# Patient Record
Sex: Female | Born: 1965 | Race: White | Hispanic: No | Marital: Married | Smoking: Never smoker
Health system: Southern US, Community
[De-identification: ages and names within clinical notes are randomized; demographics above are authoritative.]

## PROBLEM LIST (undated history)

## (undated) DIAGNOSIS — C801 Malignant (primary) neoplasm, unspecified: Secondary | ICD-10-CM

## (undated) DIAGNOSIS — K219 Gastro-esophageal reflux disease without esophagitis: Secondary | ICD-10-CM

## (undated) DIAGNOSIS — G4730 Sleep apnea, unspecified: Secondary | ICD-10-CM

## (undated) DIAGNOSIS — M1990 Unspecified osteoarthritis, unspecified site: Secondary | ICD-10-CM

## (undated) HISTORY — PX: TONSILLECTOMY: SUR1361

## (undated) HISTORY — PX: CHOLECYSTECTOMY: SHX55

## (undated) HISTORY — PX: ABDOMINAL HYSTERECTOMY: SHX81

## (undated) HISTORY — PX: OTHER SURGICAL HISTORY: SHX169

---

## 1998-01-02 ENCOUNTER — Ambulatory Visit (HOSPITAL_COMMUNITY): Admission: RE | Admit: 1998-01-02 | Discharge: 1998-01-02 | Payer: Self-pay | Admitting: Obstetrics and Gynecology

## 1998-03-05 ENCOUNTER — Other Ambulatory Visit: Admission: RE | Admit: 1998-03-05 | Discharge: 1998-03-05 | Payer: Self-pay | Admitting: Obstetrics and Gynecology

## 1998-04-02 ENCOUNTER — Inpatient Hospital Stay (HOSPITAL_COMMUNITY): Admission: AD | Admit: 1998-04-02 | Discharge: 1998-04-04 | Payer: Self-pay | Admitting: Obstetrics and Gynecology

## 1998-04-30 ENCOUNTER — Other Ambulatory Visit: Admission: RE | Admit: 1998-04-30 | Discharge: 1998-04-30 | Payer: Self-pay | Admitting: Obstetrics and Gynecology

## 1999-06-23 ENCOUNTER — Other Ambulatory Visit: Admission: RE | Admit: 1999-06-23 | Discharge: 1999-06-23 | Payer: Self-pay | Admitting: Obstetrics and Gynecology

## 1999-11-07 ENCOUNTER — Other Ambulatory Visit: Admission: RE | Admit: 1999-11-07 | Discharge: 1999-11-07 | Payer: Self-pay | Admitting: Obstetrics and Gynecology

## 1999-11-20 ENCOUNTER — Other Ambulatory Visit: Admission: RE | Admit: 1999-11-20 | Discharge: 1999-11-20 | Payer: Self-pay | Admitting: Obstetrics and Gynecology

## 1999-11-20 ENCOUNTER — Encounter (INDEPENDENT_AMBULATORY_CARE_PROVIDER_SITE_OTHER): Payer: Self-pay | Admitting: Specialist

## 1999-12-18 ENCOUNTER — Encounter (INDEPENDENT_AMBULATORY_CARE_PROVIDER_SITE_OTHER): Payer: Self-pay

## 1999-12-18 ENCOUNTER — Inpatient Hospital Stay (HOSPITAL_COMMUNITY): Admission: RE | Admit: 1999-12-18 | Discharge: 1999-12-21 | Payer: Self-pay | Admitting: Obstetrics and Gynecology

## 2001-05-09 ENCOUNTER — Other Ambulatory Visit: Admission: RE | Admit: 2001-05-09 | Discharge: 2001-05-09 | Payer: Self-pay | Admitting: Obstetrics and Gynecology

## 2005-02-01 ENCOUNTER — Emergency Department (HOSPITAL_COMMUNITY): Admission: EM | Admit: 2005-02-01 | Discharge: 2005-02-01 | Payer: Self-pay | Admitting: Emergency Medicine

## 2005-02-03 ENCOUNTER — Other Ambulatory Visit: Admission: RE | Admit: 2005-02-03 | Discharge: 2005-02-03 | Payer: Self-pay | Admitting: Obstetrics and Gynecology

## 2005-04-29 ENCOUNTER — Ambulatory Visit (HOSPITAL_COMMUNITY): Admission: RE | Admit: 2005-04-29 | Discharge: 2005-04-29 | Payer: Self-pay | Admitting: Urology

## 2005-04-29 ENCOUNTER — Ambulatory Visit (HOSPITAL_BASED_OUTPATIENT_CLINIC_OR_DEPARTMENT_OTHER): Admission: RE | Admit: 2005-04-29 | Discharge: 2005-04-29 | Payer: Self-pay | Admitting: Urology

## 2005-09-28 ENCOUNTER — Encounter: Admission: RE | Admit: 2005-09-28 | Discharge: 2005-09-28 | Payer: Self-pay | Admitting: Orthopedic Surgery

## 2008-01-28 ENCOUNTER — Emergency Department (HOSPITAL_COMMUNITY): Admission: EM | Admit: 2008-01-28 | Discharge: 2008-01-29 | Payer: Self-pay | Admitting: Emergency Medicine

## 2008-04-11 ENCOUNTER — Encounter (INDEPENDENT_AMBULATORY_CARE_PROVIDER_SITE_OTHER): Payer: Self-pay | Admitting: Surgery

## 2008-04-11 ENCOUNTER — Ambulatory Visit (HOSPITAL_COMMUNITY): Admission: RE | Admit: 2008-04-11 | Discharge: 2008-04-11 | Payer: Self-pay | Admitting: Surgery

## 2011-03-24 NOTE — Op Note (Signed)
Christina Bolton, Christina Bolton             ACCOUNT NO.:  [id number]   MEDICAL RECORD NO.:  [id number]          PATIENT TYPE:  AMB   LOCATION:  DAY                          FACILITY:  [facility]   PHYSICIAN:  Weichel A. Cornett, M.D.DATE OF BIRTH:  February 15, 1966   DATE OF PROCEDURE:  04/11/2008  DATE OF DISCHARGE:                               OPERATIVE REPORT   PREOPERATIVE DIAGNOSIS:  Symptomatic cholelithiasis.   POSTOPERATIVE DIAGNOSIS:  Symptomatic cholelithiasis.   PROCEDURE:  Laparoscopic cholecystectomy with cholangiogram.   SURGEON:  Fulfer A. Cornett, MD   ANESTHESIA:  General endotracheal anesthesia.   ASSISTANT:  Thornton Park. Daphine Deutscher, MD   ESTIMATED BLOOD LOSS:  20 mL.   SPECIMEN:  Gallbladder with gallstone that was actually taken out of the  cystic duct to pathology.   DRAINS:  None.   INDICATIONS FOR PROCEDURE:  The patient is a 45 year old female with  biliary colic.  She presents today for a laparoscopic cholecystectomy  for treatment of biliary colic.  Procedure discussed with the patient  preop.  She agreed to proceed.   DESCRIPTION OF PROCEDURE:  The patient was brought to the operating room  and placed in supine position.  After induction of general anesthesia  the abdomen was prepped and draped in a sterile fashion.  A 1-cm  infraumbilical incision was made after infiltration with 0.25%  Sensorcaine.  The fascia was identified, opened in the midline.  I was  able to spread the peritoneal lining open and enter the abdominal cavity  under direct vision.  A pursestring suture of 0Vicryl was placed and a  12-mm Hassan cannula was placed under direct vision.  Pneumoperitoneum  was created to 15 mmHg of CO2 and the laparoscope was placed.  The  patient was placed in reverse Trendelenburg and rolled to her left.  Laparoscopy performed.  No evidence of bowel injury or solid organ  injury.  I then placed an 11-mm subxiphoid port under direct vision.  Two 5-mm ports were placed  in the upper quadrant, both under direct  vision.  The gallbladder was identified, grabbed by its dome and  retracted toward the patient's right shoulder.  There were some  adhesions from the duodenum to the gallbladder, taken down carefully  with blunt dissection and electrocautery.  The infundibulum was then  identified, grabbed by a second grasper and pulled toward the patient's  right lower quadrant.  This opened up the triangle of Calot.  Cautery  was used to score the peritoneal covering of this to expose the cystic  duct entering the gallbladder.  This was dissected out  circumferentially.  The critical angle was achieved.  Two clips were  placed on the gallbladder side of the cystic duct and a small incision  was made for a cholangiogram.  Through a separate stab incision in the  upper abdomen a Cook cholangiogram catheter was introduced under direct  vision.  The catheter was then placed in the cystic duct, held in place  by a single clip.  Intraoperative cholangiogram was then performed using  one-half strength Hypaque dye and fluoroscopy.  There was free flow  of  contrast down to the cystic duct into the common duct, into the duodenum  without stricture, stone or extravasation.  There was free flow of  contrast up the common hepatic duct into right and left hepatic  ductules.  I saw no evidence of stone, stricture or extravasation there  either.  The catheter was then removed.  The cystic duct stump was  triply-clipped and divided.  The cystic artery was identified.  It was  double-clipped and divided.  Cautery was used to dissect the gallbladder  from the gallbladder bed without difficulty.  The gallbladder was then  placed in an EndoCatch bag and passed into the bag without difficulty.  Of note, during the cholangiogram a small stone was pulled out of the  cystic duct.  Irrigation was used and suctioned out.  Gallbladder bed  was examined and found to be hemostatic.  Clips  were on both cystic  artery and cystic duct without signs of bleeding or extravasation.  At  this point we then changed the scope to the subxiphoid port, extracted  the bag with gallbladder, passed it off the field and closed the  umbilical port site with the pursestring suture already in place.  We  then examined the gallbladder bed one more time, saw no signs of  bleeding or extravasation of bile.  At this point in time I removed the  5-mm ports without difficulty and allowed the CO2 to escape.  The  subxiphoid port was removed without difficulty.  There were no signs of  port site bleeding.  We then closed the skin incisions with 4-0 Monocryl  and Dermabond.  All final counts of sponges, needles and instruments  were found to be correct for this portion of the case.  The patient was  then awoken, taken to recovery in satisfactory condition.      Rendleman A. Cornett, M.D.  Electronically Signed     TAC/MEDQ  D:  04/11/2008  T:  04/11/2008  Job:  [id number]   cc:   Maren Reamer, NP

## 2011-03-27 NOTE — Op Note (Signed)
NAMESEILA, LISTON             ACCOUNT NO.:  [id number]   MEDICAL RECORD NO.:  [id number]          PATIENT TYPE:  AMB   LOCATION:  [facility]                         FACILITY:  [facility]   PHYSICIAN:  Mark C. Vernie Ammons, M.D.  DATE OF BIRTH:  [birth year]   DATE OF PROCEDURE:  04/29/2005  DATE OF DISCHARGE:                                 OPERATIVE REPORT   PREOPERATIVE DIAGNOSES:  1.  Right ureteral calculus.  2.  Stress urinary continence.   POSTOPERATIVE DIAGNOSES:  1.  Right ureteral calculus.  2.  Stress urinary continence.   PROCEDURE:  Cystoscopy, right retrograde pyelogram with interpretation,  right ureteroscopy and obturator sling [employer], historic buildings).   SURGEON:  Mark C. Vernie Ammons, M.D.   ANESTHESIA:  General.   BLOOD LOSS:  Minimal.   DRAINS:  None.   SPECIMENS:  None.   COMPLICATIONS:  None.   INDICATIONS:  The patient is 45 year old white female who was seen by me on  Apr 07, 2005 with right flank pain after a CT scan on the 26th that month  revealed a 1- to 2-mm stone at the UPJ on the right-hand side and a 3-cm  left adnexal cyst.  My KUB revealed a calcification at the location the UPJ  on the right-hand side consistent with the stone seen on CT scan.  A repeat  KUB 2 weeks later revealed that the calcification remained in the same  location.  A renal ultrasound revealed no hydronephrosis.  She also has had  significant stress urinary incontinence and we discussed sling procedure.  She would like to proceed with that at the same time.  The risks,  complications and alternatives were discussed.   DESCRIPTION OF OPERATION:  After informed consent, the patient went to the  major OR and placed on the table, administered general anesthesia, then  moved to the dorsal lithotomy position.  Her genitalia, perineum and vagina  were sterilely prepped and draped.  Initially, 1% lidocaine with epinephrine  was used to infiltrate the subvaginal mucosa in the midline.  While I  allowed time for epinephrine effect, I then proceeded with cystoscopy.  The  bladder was fully inspect and noted be free of any tumor, stones or  inflammatory lesions.  Ureteral orifices were of normal configuration and  position.  The left orifice was identified and a 5-French open-ended  ureteral catheter was then passed through the cystoscope, but I could not  get it into the orifice, which appeared small.  I therefore passed a  guidewire through the open-ended catheter and up the ureter.  I then  inserted the open-ended catheter into the right orifice and removed the  guidewire.  A retrograde pyelogram was then performed and I noted no  definite stone or filling defect, but the stone was very small and could be  missed on a retrograde pyelogram.   With the guidewire replaced, I passed the 6-French rigid ureteroscope over  the guidewire and into the right orifice, up the right ureter and noted no  stone.  As I passed the scope approximately three-fourths of the way up the  ureter,  no stone was seen.  I then observed for a stone as I withdrew the  scope and saw no stone present.   A 16-French Foley catheter was then placed in the bladder and a midline  incision was made at the mid-urethral level, which was determined by  palpation of the catheter balloon at the bladder neck.  I then dissected  laterally and next to the urethra.  I then directed my attention to the area  of the obturator fossa.  This was noted by palpation of the obturator fossa.  Five centimeters lateral and at the level of the clitoris, stab incisions  were made in the skin.  The bladder was then completely drained and the  sling trocars were then passed through the skin, through the obturator fossa  and back behind the symphysis pubis and were directed out digitally at the  mid-urethral level, first on the left, then right sides.  The sling material  was hooked to the trocar and brought out through the skin incisions.   I then  removed the Foley catheter and performed cystoscopy with the 70-degree lens.  The bladder was noted to be free of any perforation, foreign body, injury or  other abnormality.  I therefore drained the bladder and replaced the Foley  catheter and irrigated all three wounds with antibiotic solution.  I  positioned the sling at the mid-urethral level and removed the plastic  sheathing and excised the excess sling material at the skin level.  I  rechecked and made sure the sling was located at the mid-urethral level and  no tension was present.  I therefore irrigated again with antibiotic  solution and closed the midline vaginal incision with a running-locking 2-0  Vicryl suture.  No bleeding was noted after the closure of the incision,  therefore no vaginal packing was left.  I then closed the skin incisions  with Dermabond.  The catheter was removed and the patient was awakened and  taken to the recovery room in stable and satisfactory condition.  She  tolerated procedure well with no intraoperative complications.   She will be given a prescription for 36 Tylox and will take Keflex 500 mg  b.i.d. for 5 days.  She will return for followup in 7 days, sooner should  she have any difficulty.       MCO/MEDQ  D:  04/29/2005  T:  04/30/2005  Job:  [id number]

## 2011-08-03 LAB — HEPARIN LEVEL (UNFRACTIONATED): Heparin Unfractionated: <0.1 — ABNORMAL LOW

## 2011-08-03 LAB — BASIC METABOLIC PANEL
BUN: 16
CO2: 26
Calcium: 9.2
Chloride: 102
Creatinine, Ser: 0.75
GFR calc Af Amer: >60
GFR calc non Af Amer: >60
Glucose, Bld: 101 — ABNORMAL HIGH
Potassium: 3.9
Sodium: 136

## 2011-08-03 LAB — DIFFERENTIAL
Basophils Absolute: 0
Basophils Relative: 0
Eosinophils Absolute: 0.1
Eosinophils Relative: 1
Lymphocytes Relative: 10 — ABNORMAL LOW
Lymphs Abs: 1.4
Monocytes Absolute: 0.5
Monocytes Relative: 4
Neutro Abs: 11.8 — ABNORMAL HIGH
Neutrophils Relative %: 86 — ABNORMAL HIGH

## 2011-08-03 LAB — POCT CARDIAC MARKERS
CKMB, poc: <1 — ABNORMAL LOW
CKMB, poc: <1 — ABNORMAL LOW
Myoglobin, poc: 21
Myoglobin, poc: 33.7
Operator id: 4661
Operator id: 4661
Troponin i, poc: <0.05
Troponin i, poc: <0.05

## 2011-08-03 LAB — LIPASE, BLOOD: Lipase: 25

## 2011-08-03 LAB — CBC
HCT: 42
Hemoglobin: 14.2
MCHC: 33.8
MCV: 86.5
Platelets: 235
RBC: 4.86
RDW: 13.2
WBC: 13.8 — ABNORMAL HIGH

## 2011-08-03 LAB — HEPATIC FUNCTION PANEL
ALT: 32
AST: 83 — ABNORMAL HIGH
Albumin: 4.2
Alkaline Phosphatase: 57
Bilirubin, Direct: 0.3
Indirect Bilirubin: 0.6
Total Bilirubin: 0.9
Total Protein: 7.4

## 2011-08-06 LAB — BASIC METABOLIC PANEL
BUN: 13
CO2: 29
Calcium: 9
Chloride: 104
Creatinine, Ser: 0.84
GFR calc Af Amer: >60
GFR calc non Af Amer: >60
Glucose, Bld: 97
Potassium: 5.4 — ABNORMAL HIGH
Sodium: 140

## 2011-08-06 LAB — CBC
HCT: 39.1
Hemoglobin: 13.3
MCHC: 34.2
MCV: 87.4
Platelets: 195
RBC: 4.47
RDW: 13.4
WBC: 5.1

## 2011-08-06 LAB — DIFFERENTIAL
Basophils Absolute: 0
Basophils Relative: 1
Eosinophils Absolute: 0.2
Eosinophils Relative: 3
Lymphocytes Relative: 40
Lymphs Abs: 2.1
Monocytes Absolute: 0.3
Monocytes Relative: 5
Neutro Abs: 2.6
Neutrophils Relative %: 51

## 2012-11-15 ENCOUNTER — Other Ambulatory Visit: Payer: Self-pay | Admitting: Obstetrics and Gynecology

## 2012-11-15 DIAGNOSIS — R928 Other abnormal and inconclusive findings on diagnostic imaging of breast: Secondary | ICD-10-CM

## 2012-11-22 ENCOUNTER — Other Ambulatory Visit: Payer: Self-pay

## 2012-11-23 ENCOUNTER — Ambulatory Visit
Admission: RE | Admit: 2012-11-23 | Discharge: 2012-11-23 | Disposition: A | Discharge status: Home / Self Care | Payer: BC Managed Care – PPO | Source: Ambulatory Visit | Attending: Obstetrics and Gynecology | Admitting: Obstetrics and Gynecology

## 2012-11-23 DIAGNOSIS — R928 Other abnormal and inconclusive findings on diagnostic imaging of breast: Secondary | ICD-10-CM

## 2013-09-04 ENCOUNTER — Other Ambulatory Visit: Payer: Self-pay | Admitting: Obstetrics and Gynecology

## 2013-09-04 DIAGNOSIS — N6320 Unspecified lump in the left breast, unspecified quadrant: Secondary | ICD-10-CM

## 2013-10-25 ENCOUNTER — Ambulatory Visit
Admission: RE | Admit: 2013-10-25 | Discharge: 2013-10-25 | Disposition: A | Discharge status: Home / Self Care | Payer: BC Managed Care – PPO | Source: Ambulatory Visit | Attending: Obstetrics and Gynecology | Admitting: Obstetrics and Gynecology

## 2013-10-25 DIAGNOSIS — N6320 Unspecified lump in the left breast, unspecified quadrant: Secondary | ICD-10-CM

## 2015-09-13 ENCOUNTER — Other Ambulatory Visit: Payer: Self-pay

## 2015-09-13 DIAGNOSIS — Z1231 Encounter for screening mammogram for malignant neoplasm of breast: Secondary | ICD-10-CM

## 2015-10-17 ENCOUNTER — Ambulatory Visit: Payer: BC Managed Care – PPO

## 2015-11-05 ENCOUNTER — Ambulatory Visit: Payer: BC Managed Care – PPO

## 2016-02-26 ENCOUNTER — Ambulatory Visit
Admission: RE | Admit: 2016-02-26 | Discharge: 2016-02-26 | Disposition: A | Discharge status: Home / Self Care | Payer: BC Managed Care – PPO | Source: Ambulatory Visit

## 2016-02-26 DIAGNOSIS — Z1231 Encounter for screening mammogram for malignant neoplasm of breast: Secondary | ICD-10-CM

## 2017-10-05 ENCOUNTER — Other Ambulatory Visit: Payer: Self-pay | Admitting: Physician Assistant

## 2017-10-05 DIAGNOSIS — Z1231 Encounter for screening mammogram for malignant neoplasm of breast: Secondary | ICD-10-CM

## 2017-11-10 ENCOUNTER — Ambulatory Visit
Admission: RE | Admit: 2017-11-10 | Discharge: 2017-11-10 | Disposition: A | Discharge status: Home / Self Care | Payer: BC Managed Care – PPO | Source: Ambulatory Visit | Attending: Physician Assistant | Admitting: Physician Assistant

## 2017-11-10 DIAGNOSIS — Z1231 Encounter for screening mammogram for malignant neoplasm of breast: Secondary | ICD-10-CM

## 2019-04-18 ENCOUNTER — Other Ambulatory Visit: Payer: Self-pay | Admitting: Physician Assistant

## 2019-04-18 DIAGNOSIS — Z1231 Encounter for screening mammogram for malignant neoplasm of breast: Secondary | ICD-10-CM

## 2019-06-13 ENCOUNTER — Ambulatory Visit: Payer: BC Managed Care – PPO

## 2019-08-10 ENCOUNTER — Ambulatory Visit
Admission: RE | Admit: 2019-08-10 | Discharge: 2019-08-10 | Disposition: A | Discharge status: Home / Self Care | Payer: BC Managed Care – PPO | Source: Ambulatory Visit | Attending: Physician Assistant | Admitting: Physician Assistant

## 2019-08-10 ENCOUNTER — Other Ambulatory Visit: Payer: Self-pay

## 2019-08-10 DIAGNOSIS — Z1231 Encounter for screening mammogram for malignant neoplasm of breast: Secondary | ICD-10-CM

## 2019-10-26 ENCOUNTER — Other Ambulatory Visit: Payer: Self-pay

## 2019-10-26 ENCOUNTER — Encounter: Payer: Self-pay | Admitting: Podiatry

## 2019-10-26 ENCOUNTER — Other Ambulatory Visit: Payer: Self-pay | Admitting: Podiatry

## 2019-10-26 ENCOUNTER — Ambulatory Visit (INDEPENDENT_AMBULATORY_CARE_PROVIDER_SITE_OTHER): Payer: BC Managed Care – PPO

## 2019-10-26 ENCOUNTER — Ambulatory Visit (INDEPENDENT_AMBULATORY_CARE_PROVIDER_SITE_OTHER): Payer: BC Managed Care – PPO | Admitting: Podiatry

## 2019-10-26 VITALS — BP 117/63

## 2019-10-26 DIAGNOSIS — M7672 Peroneal tendinitis, left leg: Secondary | ICD-10-CM

## 2019-10-26 DIAGNOSIS — M25572 Pain in left ankle and joints of left foot: Secondary | ICD-10-CM | POA: Diagnosis not present

## 2019-10-30 NOTE — Progress Notes (Signed)
Subjective:   Patient ID: Christina Bolton, female   DOB: 53 y.o.   MRN: [id number]   HPI Patient presents stating she is getting quite a bit of pain in the outside of her left foot and states that she felt like she twisted it or did something and it is been bothering her since.  Patient does not smoke likes to be active   Review of Systems  All other systems reviewed and are negative.       Objective:  Physical Exam Vitals and nursing note reviewed.  Constitutional:      Appearance: She is well-developed.  Pulmonary:     Effort: Pulmonary effort is normal.  Musculoskeletal:        General: Normal range of motion.  Skin:    General: Skin is warm.  Neurological:     Mental Status: She is alert.     Neurovascular status intact muscle strength found to be adequate range of motion within normal limits with patient found to have quite a bit of discomfort in the outside of the left foot in the peroneal complex with inflammation fluid near its insertion into the base of the fifth metatarsal with quite a bit of pain noted upon palpation but no indication of muscle strength loss or other pathology     Assessment:  Peroneal tendinitis left with inflammation fluid buildup     Plan:  H&P conditions reviewed sterile prep done and carefully injected the peroneal tendon complex left 3 mg Kenalog 5 mg Xylocaine and advised on physical therapy stretching exercises and reappoint to recheck  X-rays were negative for signs of fracture or bony injury associated with this condition

## 2020-01-13 ENCOUNTER — Ambulatory Visit: Payer: BC Managed Care – PPO | Attending: Internal Medicine

## 2020-01-13 DIAGNOSIS — Z23 Encounter for immunization: Secondary | ICD-10-CM | POA: Insufficient documentation

## 2020-01-13 NOTE — Progress Notes (Signed)
   Covid-19 Vaccination Clinic  Name:  Christina Bolton    MRN: [id number] DOB: 1965/11/12  01/13/2020  Christina Bolton was observed post Covid-19 immunization for 15 minutes without incident. She was provided with Vaccine Information Sheet and instruction to access the V-Safe system.   Christina Bolton was instructed to call 911 with any severe reactions post vaccine: Marland Kitchen Difficulty breathing  . Swelling of face and throat  . A fast heartbeat  . A bad rash all over body  . Dizziness and weakness   Immunizations Administered    Name Date Dose VIS Date Route   Pfizer COVID-19 Vaccine 01/13/2020 10:10 AM 0.3 mL 10/20/2019 Intramuscular   Manufacturer: [place]   Lot: [id number]   [place]: [id number]

## 2020-02-03 ENCOUNTER — Ambulatory Visit: Payer: BC Managed Care – PPO

## 2020-02-17 ENCOUNTER — Ambulatory Visit: Payer: BC Managed Care – PPO | Attending: Internal Medicine

## 2020-07-18 ENCOUNTER — Other Ambulatory Visit: Payer: Self-pay | Admitting: Physician Assistant

## 2020-07-18 DIAGNOSIS — Z1231 Encounter for screening mammogram for malignant neoplasm of breast: Secondary | ICD-10-CM

## 2020-08-15 ENCOUNTER — Other Ambulatory Visit: Payer: Self-pay

## 2020-08-15 ENCOUNTER — Ambulatory Visit
Admission: RE | Admit: 2020-08-15 | Discharge: 2020-08-15 | Disposition: A | Discharge status: Home / Self Care | Payer: BC Managed Care – PPO | Source: Ambulatory Visit | Attending: Physician Assistant | Admitting: Physician Assistant

## 2020-08-15 DIAGNOSIS — Z1231 Encounter for screening mammogram for malignant neoplasm of breast: Secondary | ICD-10-CM

## 2020-10-24 ENCOUNTER — Encounter (HOSPITAL_COMMUNITY)
Admission: RE | Admit: 2020-10-24 | Discharge: 2020-10-24 | Disposition: A | Discharge status: Home / Self Care | Payer: BC Managed Care – PPO | Source: Ambulatory Visit | Attending: Specialist | Admitting: Specialist

## 2020-10-24 ENCOUNTER — Encounter (HOSPITAL_COMMUNITY): Payer: Self-pay

## 2020-10-24 ENCOUNTER — Other Ambulatory Visit: Payer: Self-pay

## 2020-10-24 HISTORY — DX: Malignant (primary) neoplasm, unspecified: C80.1

## 2020-10-24 HISTORY — DX: Unspecified osteoarthritis, unspecified site: M19.90

## 2020-10-24 HISTORY — DX: Gastro-esophageal reflux disease without esophagitis: K21.9

## 2020-10-24 HISTORY — DX: Sleep apnea, unspecified: G47.30

## 2020-10-24 NOTE — Progress Notes (Signed)
DUE TO COVID-19 ONLY ONE VISITOR IS ALLOWED TO COME WITH YOU AND STAY IN THE WAITING ROOM ONLY DURING PRE OP AND PROCEDURE DAY OF SURGERY. THE 1 VISITOR  MAY VISIT WITH YOU AFTER SURGERY IN YOUR PRIVATE ROOM DURING VISITING HOURS ONLY!  YOU NEED TO HAVE A COVID 19 TEST ON__12/18/2021 _____ @_______ , THIS TEST MUST BE DONE BEFORE SURGERY,  COVID TESTING SITE [address] [place] [address], IT IS ON THE RIGHT GOING OUT [address] APPROXIMATELY  2 MINUTES PAST [employer] ON THE RIGHT. ONCE YOUR COVID TEST IS COMPLETED,  PLEASE BEGIN THE QUARANTINE INSTRUCTIONS AS OUTLINED IN YOUR HANDOUT.                Christina Bolton  10/24/2020   Your procedure is scheduled on: 10/30/2020    Report to [facility] Main  Entrance   Report to admitting at  0830 AM     Call this number if you have problems the morning of surgery [phone]    Remember: Do not eat food , candy gum or mints :After Midnight. You may have clear liquids from midnight until 0730am     CLEAR LIQUID DIET   Foods Allowed                                                                       Coffee and tea, regular and decaf                              Plain Jell-O any favor except red or purple                                            Fruit ices (not with fruit pulp)                                      Iced Popsicles                                     Carbonated beverages, regular and diet                                    Cranberry, grape and apple juices Sports drinks like Gatorade Lightly seasoned clear broth or consume(fat free) Sugar, honey syrup   _____________________________________________________________________    BRUSH YOUR TEETH MORNING OF SURGERY AND RINSE YOUR MOUTH OUT, NO CHEWING GUM CANDY OR MINTS.     Take these medicines the morning of surgery with A SIP OF WATER: nexium   DO NOT TAKE ANY DIABETIC MEDICATIONS DAY OF YOUR SURGERY                                You may not have any metal on your body including  hair pins and              piercings  Do not wear jewelry, make-up, lotions, powders or perfumes, deodorant             Do not wear nail polish on your fingernails.  Do not shave  48 hours prior to surgery.              Men may shave face and neck.   Do not bring valuables to the hospital. [place].  Contacts, dentures or bridgework may not be worn into surgery.  Leave suitcase in the car. After surgery it may be brought to your room.     Patients discharged the day of surgery will not be allowed to drive home. IF YOU ARE HAVING SURGERY AND GOING HOME THE SAME DAY, YOU MUST HAVE AN ADULT TO DRIVE YOU HOME AND BE WITH YOU FOR 24 HOURS. YOU MAY GO HOME BY TAXI OR UBER OR ORTHERWISE, BUT AN ADULT MUST ACCOMPANY YOU HOME AND STAY WITH YOU FOR 24 HOURS.  Name and phone number of your driver:  Special Instructions: N/A              Please read over the following fact sheets you were given: _____________________________________________________________________  [facility] - Preparing for Surgery Before surgery, you can play an important role.  Because skin is not sterile, your skin needs to be as free of germs as possible.  You can reduce the number of germs on your skin by washing with CHG (chlorahexidine gluconate) soap before surgery.  CHG is an antiseptic cleaner which kills germs and bonds with the skin to continue killing germs even after washing. Please DO NOT use if you have an allergy to CHG or antibacterial soaps.  If your skin becomes reddened/irritated stop using the CHG and inform your nurse when you arrive at Short Stay. Do not shave (including legs and underarms) for at least 48 hours prior to the first CHG shower.  You may shave your face/neck. Please follow these instructions carefully:  1.  Shower with CHG Soap the night before surgery and the  morning of Surgery.  2.  If you  choose to wash your hair, wash your hair first as usual with your  normal  shampoo.  3.  After you shampoo, rinse your hair and body thoroughly to remove the  shampoo.                           4.  Use CHG as you would any other liquid soap.  You can apply chg directly  to the skin and wash                       Gently with a scrungie or clean washcloth.  5.  Apply the CHG Soap to your body ONLY FROM THE NECK DOWN.   Do not use on face/ open                           Wound or open sores. Avoid contact with eyes, ears mouth and genitals (private parts).                       Wash face,  Genitals (private parts) with  your normal soap.             6.  Wash thoroughly, paying special attention to the area where your surgery  will be performed.  7.  Thoroughly rinse your body with warm water from the neck down.  8.  DO NOT shower/wash with your normal soap after using and rinsing off  the CHG Soap.                9.  Pat yourself dry with a clean towel.            10.  Wear clean pajamas.            11.  Place clean sheets on your bed the night of your first shower and do not  sleep with pets. Day of Surgery : Do not apply any lotions/deodorants the morning of surgery.  Please wear clean clothes to the hospital/surgery center.  FAILURE TO FOLLOW THESE INSTRUCTIONS MAY RESULT IN THE CANCELLATION OF YOUR SURGERY PATIENT SIGNATURE_________________________________  NURSE SIGNATURE__________________________________  ________________________________________________________________________

## 2020-10-24 NOTE — Progress Notes (Signed)
CBC and CMP done 10/21/2020 at Wellness Visit located in [place].

## 2020-10-24 NOTE — Progress Notes (Signed)
NO orders in at preop. Orders requested on 10/22/2020 and 10/24/2020.

## 2020-10-25 NOTE — H&P (Signed)
Christina Bolton is an 54 y.o. female.   Chief Complaint: Left knee pain HPI:  Patient reports left knee pain that has been going on for over 6 months now. Denies any injury that she can think of. She has tried non-operative treatments but has failed. An MRI of her left knee was obtained and showed a medial meniscus tear. She has had a previous injection.   Past Medical History:  Diagnosis Date  . Arthritis   . Cancer (HCC)    cervical cancer  . GERD (gastroesophageal reflux disease)   . Sleep apnea    cpap     Past Surgical History:  Procedure Laterality Date  . ABDOMINAL HYSTERECTOMY    . bilateral feet surgery     . CHOLECYSTECTOMY    . TONSILLECTOMY      No family history on file. Social History:  reports that she has never smoked. She has never used smokeless tobacco. She reports current alcohol use. She reports that she does not use drugs.  Allergies:  Allergies  Allergen Reactions  . Bee Venom Itching and Swelling  . Coconut Oil     Unknown reaction    No medications prior to admission.    No results found for this or any previous visit (from the past 48 hour(s)). No results found.  Review of Systems  All other systems reviewed and are negative.   There were no vitals taken for this visit. Physical Exam Constitutional:      Appearance: Normal appearance. She is normal weight.  HENT:     Head: Normocephalic and atraumatic.  Musculoskeletal:        General: Swelling and tenderness present.  Skin:    General: Skin is warm and dry.     Capillary Refill: Capillary refill takes less than 2 seconds.  Neurological:     General: No focal deficit present.     Mental Status: She is alert and oriented to person, place, and time.  Psychiatric:        Mood and Affect: Mood normal.        Behavior: Behavior normal.        Thought Content: Thought content normal.        Judgment: Judgment normal.      Assessment/Plan Left knee Medial Meniscus tear: -Patient  has a medial meniscus tear and osteoarthritis of the left knee. She has failed conservative management up to this point. She has elected to proceed with surgical intervention at this time. Risks and benefits of surgery have been discussed. She will be having a left knee arthroscopy partial medial meniscectomy and chondroplasty.   Drue Novel, PA 10/25/2020, 8:48 AM

## 2020-10-26 ENCOUNTER — Other Ambulatory Visit (HOSPITAL_COMMUNITY)
Admission: RE | Admit: 2020-10-26 | Discharge: 2020-10-26 | Disposition: A | Discharge status: Home / Self Care | Payer: BC Managed Care – PPO | Source: Ambulatory Visit | Attending: Specialist | Admitting: Specialist

## 2020-10-26 DIAGNOSIS — Z20822 Contact with and (suspected) exposure to covid-19: Secondary | ICD-10-CM | POA: Insufficient documentation

## 2020-10-26 DIAGNOSIS — Z01812 Encounter for preprocedural laboratory examination: Secondary | ICD-10-CM | POA: Diagnosis present

## 2020-10-26 LAB — SARS CORONAVIRUS 2 (TAT 6-24 HRS): SARS Coronavirus 2: NEGATIVE

## 2020-10-29 MED ORDER — DEXTROSE 5 % IV SOLN
3.0000 g | INTRAVENOUS | Status: AC
  Administered 2020-10-30: 2 g via INTRAVENOUS
  Filled 2020-10-29: qty 3

## 2020-10-30 ENCOUNTER — Ambulatory Visit (HOSPITAL_COMMUNITY): Payer: BC Managed Care – PPO | Admitting: Certified Registered"

## 2020-10-30 ENCOUNTER — Encounter (HOSPITAL_COMMUNITY)
Admission: RE | Disposition: A | Discharge status: Home / Self Care | Payer: Self-pay | Source: Home / Self Care | Attending: Specialist

## 2020-10-30 ENCOUNTER — Encounter (HOSPITAL_COMMUNITY): Payer: Self-pay | Admitting: Specialist

## 2020-10-30 ENCOUNTER — Ambulatory Visit (HOSPITAL_COMMUNITY)
Admission: RE | Admit: 2020-10-30 | Discharge: 2020-10-30 | Disposition: A | Discharge status: Home / Self Care | Payer: BC Managed Care – PPO | Attending: Specialist | Admitting: Specialist

## 2020-10-30 DIAGNOSIS — X58XXXA Exposure to other specified factors, initial encounter: Secondary | ICD-10-CM | POA: Diagnosis not present

## 2020-10-30 DIAGNOSIS — Z8541 Personal history of malignant neoplasm of cervix uteri: Secondary | ICD-10-CM | POA: Insufficient documentation

## 2020-10-30 DIAGNOSIS — M1712 Unilateral primary osteoarthritis, left knee: Secondary | ICD-10-CM | POA: Insufficient documentation

## 2020-10-30 DIAGNOSIS — M2242 Chondromalacia patellae, left knee: Secondary | ICD-10-CM | POA: Insufficient documentation

## 2020-10-30 DIAGNOSIS — S83232A Complex tear of medial meniscus, current injury, left knee, initial encounter: Secondary | ICD-10-CM | POA: Insufficient documentation

## 2020-10-30 HISTORY — PX: KNEE ARTHROSCOPY WITH MEDIAL MENISECTOMY: SHX5651

## 2020-10-30 SURGERY — ARTHROSCOPY, KNEE, WITH MEDIAL MENISCECTOMY
Anesthesia: General | Site: Knee | Laterality: Left

## 2020-10-30 MED ORDER — METHOCARBAMOL 500 MG PO TABS: 500.0000 mg | ORAL_TABLET | Freq: Four times a day (QID) | ORAL | 0 refills | Status: AC

## 2020-10-30 MED ORDER — MIDAZOLAM HCL 2 MG/2ML IJ SOLN
INTRAMUSCULAR | Status: AC
  Filled 2020-10-30: qty 2

## 2020-10-30 MED ORDER — AMISULPRIDE (ANTIEMETIC) 5 MG/2ML IV SOLN: 10.0000 mg | Freq: Once | INTRAVENOUS | Status: AC | PRN

## 2020-10-30 MED ORDER — CHLORHEXIDINE GLUCONATE 0.12 % MT SOLN
15.0000 mL | Freq: Once | OROMUCOSAL | Status: AC
  Administered 2020-10-30: 15 mL via OROMUCOSAL

## 2020-10-30 MED ORDER — HYDROMORPHONE HCL 1 MG/ML IJ SOLN
0.2500 mg | INTRAMUSCULAR | Status: DC | PRN
  Administered 2020-10-30: 0.5 mg via INTRAVENOUS

## 2020-10-30 MED ORDER — ONDANSETRON HCL 4 MG PO TABS: 4.0000 mg | ORAL_TABLET | Freq: Three times a day (TID) | ORAL | 1 refills | Status: AC | PRN

## 2020-10-30 MED ORDER — FENTANYL CITRATE (PF) 100 MCG/2ML IJ SOLN
INTRAMUSCULAR | Status: AC
  Filled 2020-10-30: qty 2

## 2020-10-30 MED ORDER — PROPOFOL 10 MG/ML IV BOLUS
INTRAVENOUS | Status: DC | PRN
  Administered 2020-10-30: 100 mg via INTRAVENOUS
  Administered 2020-10-30: 40 mg via INTRAVENOUS
  Administered 2020-10-30: 200 mg via INTRAVENOUS
  Administered 2020-10-30: 30 mg via INTRAVENOUS
  Administered 2020-10-30: 100 mg via INTRAVENOUS

## 2020-10-30 MED ORDER — OXYCODONE HCL 5 MG/5ML PO SOLN: 5.0000 mg | Freq: Once | ORAL | Status: DC | PRN

## 2020-10-30 MED ORDER — ONDANSETRON HCL 4 MG/2ML IJ SOLN
INTRAMUSCULAR | Status: AC
  Filled 2020-10-30: qty 2

## 2020-10-30 MED ORDER — MIDAZOLAM HCL 2 MG/2ML IJ SOLN
INTRAMUSCULAR | Status: DC | PRN
  Administered 2020-10-30 (×2): 1 mg via INTRAVENOUS

## 2020-10-30 MED ORDER — ACETAMINOPHEN 10 MG/ML IV SOLN
INTRAVENOUS | Status: AC
  Filled 2020-10-30: qty 100

## 2020-10-30 MED ORDER — OXYCODONE HCL 5 MG PO TABS: 5.0000 mg | ORAL_TABLET | ORAL | 0 refills | Status: AC | PRN

## 2020-10-30 MED ORDER — MEPERIDINE HCL 50 MG/ML IJ SOLN: 6.2500 mg | INTRAMUSCULAR | Status: DC | PRN

## 2020-10-30 MED ORDER — ACETAMINOPHEN 160 MG/5ML PO SOLN: 325.0000 mg | Freq: Once | ORAL | Status: DC | PRN

## 2020-10-30 MED ORDER — AMISULPRIDE (ANTIEMETIC) 5 MG/2ML IV SOLN
INTRAVENOUS | Status: AC
  Filled 2020-10-30: qty 2

## 2020-10-30 MED ORDER — DEXAMETHASONE SODIUM PHOSPHATE 10 MG/ML IJ SOLN
INTRAMUSCULAR | Status: DC | PRN
  Administered 2020-10-30: 8 mg via INTRAVENOUS

## 2020-10-30 MED ORDER — FENTANYL CITRATE (PF) 100 MCG/2ML IJ SOLN
INTRAMUSCULAR | Status: DC | PRN
  Administered 2020-10-30 (×4): 50 ug via INTRAVENOUS

## 2020-10-30 MED ORDER — PROPOFOL 10 MG/ML IV BOLUS
INTRAVENOUS | Status: AC
  Filled 2020-10-30: qty 20

## 2020-10-30 MED ORDER — LACTATED RINGERS IV SOLN: INTRAVENOUS | Status: DC

## 2020-10-30 MED ORDER — FENTANYL CITRATE (PF) 100 MCG/2ML IJ SOLN
50.0000 ug | INTRAMUSCULAR | Status: DC
  Administered 2020-10-30: 50 ug via INTRAVENOUS
  Filled 2020-10-30: qty 2

## 2020-10-30 MED ORDER — ACETAMINOPHEN 325 MG PO TABS: 325.0000 mg | ORAL_TABLET | Freq: Once | ORAL | Status: DC | PRN

## 2020-10-30 MED ORDER — SODIUM CHLORIDE 0.9 % IR SOLN
Status: DC | PRN
  Administered 2020-10-30: 6000 mL

## 2020-10-30 MED ORDER — DEXAMETHASONE SODIUM PHOSPHATE 10 MG/ML IJ SOLN
INTRAMUSCULAR | Status: AC
  Filled 2020-10-30: qty 1

## 2020-10-30 MED ORDER — ONDANSETRON HCL 4 MG/2ML IJ SOLN
INTRAMUSCULAR | Status: DC | PRN
  Administered 2020-10-30: 4 mg via INTRAVENOUS

## 2020-10-30 MED ORDER — BUPIVACAINE-EPINEPHRINE (PF) 0.5% -1:200000 IJ SOLN
INTRAMUSCULAR | Status: DC | PRN
  Administered 2020-10-30: 20 mL via PERINEURAL

## 2020-10-30 MED ORDER — MORPHINE SULFATE (PF) 4 MG/ML IV SOLN
INTRAVENOUS | Status: AC
  Filled 2020-10-30: qty 1

## 2020-10-30 MED ORDER — TRIAMCINOLONE ACETONIDE 40 MG/ML IJ SUSP
INTRAMUSCULAR | Status: AC
  Filled 2020-10-30: qty 2

## 2020-10-30 MED ORDER — BUPIVACAINE-EPINEPHRINE (PF) 0.5% -1:200000 IJ SOLN: INTRAMUSCULAR | Status: DC | PRN

## 2020-10-30 MED ORDER — ORAL CARE MOUTH RINSE: 15.0000 mL | Freq: Once | OROMUCOSAL | Status: AC

## 2020-10-30 MED ORDER — LIDOCAINE 2% (20 MG/ML) 5 ML SYRINGE
INTRAMUSCULAR | Status: DC | PRN
  Administered 2020-10-30: 40 mg via INTRAVENOUS

## 2020-10-30 MED ORDER — TRIAMCINOLONE ACETONIDE 40 MG/ML IJ SUSP
INTRAMUSCULAR | Status: DC | PRN
Start: 2020-10-30 — End: 2020-10-30
  Administered 2020-10-30: 80 mg via INTRA_ARTICULAR

## 2020-10-30 MED ORDER — BUPIVACAINE HCL 0.25 % IJ SOLN
INTRAMUSCULAR | Status: AC
  Filled 2020-10-30: qty 1

## 2020-10-30 MED ORDER — OXYCODONE HCL 5 MG PO TABS
5.0000 mg | ORAL_TABLET | Freq: Once | ORAL | Status: DC | PRN
Start: 2020-10-30 — End: 2020-10-30

## 2020-10-30 MED ORDER — AMISULPRIDE (ANTIEMETIC) 5 MG/2ML IV SOLN
INTRAVENOUS | Status: AC
  Administered 2020-10-30: 10 mg via INTRAVENOUS
  Filled 2020-10-30: qty 2

## 2020-10-30 MED ORDER — DEXMEDETOMIDINE (PRECEDEX) IN NS 20 MCG/5ML (4 MCG/ML) IV SYRINGE
PREFILLED_SYRINGE | INTRAVENOUS | Status: DC | PRN
  Administered 2020-10-30 (×2): 10 ug via INTRAVENOUS

## 2020-10-30 MED ORDER — MIDAZOLAM HCL 2 MG/2ML IJ SOLN
1.0000 mg | INTRAMUSCULAR | Status: DC
  Administered 2020-10-30: 1 mg via INTRAVENOUS
  Filled 2020-10-30: qty 2

## 2020-10-30 MED ORDER — HYDROMORPHONE HCL 1 MG/ML IJ SOLN
INTRAMUSCULAR | Status: AC
  Administered 2020-10-30: 0.5 mg via INTRAVENOUS
  Filled 2020-10-30: qty 1

## 2020-10-30 MED ORDER — CEPHALEXIN 500 MG PO CAPS: 500.0000 mg | ORAL_CAPSULE | Freq: Four times a day (QID) | ORAL | 0 refills | Status: AC

## 2020-10-30 MED ORDER — BUPIVACAINE HCL 0.25 % IJ SOLN
INTRAMUSCULAR | Status: DC | PRN
  Administered 2020-10-30: 15 mL
  Administered 2020-10-30: 5 mL

## 2020-10-30 MED ORDER — ACETAMINOPHEN 10 MG/ML IV SOLN
1000.0000 mg | Freq: Once | INTRAVENOUS | Status: DC | PRN
  Administered 2020-10-30: 1000 mg via INTRAVENOUS

## 2020-10-30 SURGICAL SUPPLY — 39 items
BANDAGE ESMARK 6X9 LF (GAUZE/BANDAGES/DRESSINGS) ×1 IMPLANT
BLADE EXCALIBUR 4.0X13 (MISCELLANEOUS) ×1 IMPLANT
BNDG CMPR 9X6 STRL LF SNTH (GAUZE/BANDAGES/DRESSINGS)
BNDG ELASTIC 6X5.8 VLCR STR LF (GAUZE/BANDAGES/DRESSINGS) ×1 IMPLANT
BNDG ESMARK 6X9 LF (GAUZE/BANDAGES/DRESSINGS)
BNDG GAUZE ELAST 4 BULKY (GAUZE/BANDAGES/DRESSINGS) ×3 IMPLANT
CANISTER SUCT 3000ML PPV (MISCELLANEOUS) ×2 IMPLANT
COVER WAND RF STERILE (DRAPES) ×2 IMPLANT
CUFF TOURN SGL QUICK 34 (TOURNIQUET CUFF) ×2
CUFF TRNQT CYL 34X4.125X (TOURNIQUET CUFF) ×1 IMPLANT
DRAPE ARTHROSCOPY W/POUCH 114 (DRAPES) ×2 IMPLANT
DRAPE INCISE 23X17 IOBAN STRL (DRAPES) ×1
DRAPE INCISE 23X17 STRL (DRAPES) ×1 IMPLANT
DRAPE INCISE IOBAN 23X17 STRL (DRAPES) ×1 IMPLANT
DRAPE INCISE IOBAN 66X45 STRL (DRAPES) ×2 IMPLANT
DRAPE U-SHAPE 47X51 STRL (DRAPES) ×2 IMPLANT
DRSG PAD ABDOMINAL 8X10 ST (GAUZE/BANDAGES/DRESSINGS) ×4 IMPLANT
DURAPREP 26ML APPLICATOR (WOUND CARE) ×2 IMPLANT
EXCALIBUR 3.8MM X 13CM (MISCELLANEOUS) ×1 IMPLANT
GAUZE SPONGE 4X4 12PLY STRL (GAUZE/BANDAGES/DRESSINGS) ×2 IMPLANT
GAUZE XEROFORM 1X8 LF (GAUZE/BANDAGES/DRESSINGS) ×2 IMPLANT
GLOVE BIO SURGEON STRL SZ8 (GLOVE) ×2 IMPLANT
GLOVE INDICATOR 8.0 STRL GRN (GLOVE) ×3 IMPLANT
GOWN STRL REUS W/ TWL XL LVL3 (GOWN DISPOSABLE) ×2 IMPLANT
GOWN STRL REUS W/TWL XL LVL3 (GOWN DISPOSABLE) ×4
KIT BASIN OR (CUSTOM PROCEDURE TRAY) ×2 IMPLANT
KIT TURNOVER CYSTO (KITS) ×2 IMPLANT
MANIFOLD NEPTUNE II (INSTRUMENTS) IMPLANT
NEEDLE HYPO 22GX1.5 SAFETY (NEEDLE) ×2 IMPLANT
PACK ARTHROSCOPY DSU (CUSTOM PROCEDURE TRAY) ×2 IMPLANT
PAD ARMBOARD 7.5X6 YLW CONV (MISCELLANEOUS) IMPLANT
PROBE BIPOLAR ATHRO 135MM 90D (MISCELLANEOUS) ×2 IMPLANT
SUT ETHILON 4 0 PS 2 18 (SUTURE) ×2 IMPLANT
SYR CONTROL 10ML LL (SYRINGE) ×2 IMPLANT
TOWEL OR 17X26 10 PK STRL BLUE (TOWEL DISPOSABLE) ×4 IMPLANT
TUBING ARTHROSCOPY IRRIG 16FT (MISCELLANEOUS) ×1 IMPLANT
TUBING CONNECTING 10 (TUBING) ×2 IMPLANT
WAND APOLLORF SJ50 AR-9845 (SURGICAL WAND) ×1 IMPLANT
WATER STERILE IRR 500ML POUR (IV SOLUTION) ×2 IMPLANT

## 2020-10-30 NOTE — Anesthesia Preprocedure Evaluation (Addendum)
Anesthesia Evaluation  Patient identified by MRN, date of birth, ID band Patient awake    Reviewed: Allergy & Precautions, NPO status , Patient's Chart, lab work & pertinent test results  Airway Mallampati: II  TM Distance: >3 FB Neck ROM: Full    Dental  (+) Teeth Intact, Dental Advisory Given   Pulmonary sleep apnea ,    breath sounds clear to auscultation       Cardiovascular  Rhythm:Regular Rate:Normal     Neuro/Psych negative neurological ROS  negative psych ROS   GI/Hepatic Neg liver ROS, GERD  Medicated,  Endo/Other  negative endocrine ROS  Renal/GU negative Renal ROS     Musculoskeletal  (+) Arthritis ,   Abdominal (+) + obese,   Peds  Hematology negative hematology ROS (+)   Anesthesia Other Findings   Reproductive/Obstetrics                            Anesthesia Physical Anesthesia Plan  ASA: III  Anesthesia Plan: General   Post-op Pain Management: GA combined w/ Regional for post-op pain   Induction: Intravenous  PONV Risk Score and Plan: 4 or greater and Ondansetron, Dexamethasone, Midazolam and Scopolamine patch - Pre-op  Airway Management Planned: LMA  Additional Equipment: None  Intra-op Plan:   Post-operative Plan: Extubation in OR  Informed Consent: I have reviewed the patients History and Physical, chart, labs and discussed the procedure including the risks, benefits and alternatives for the proposed anesthesia with the patient or authorized representative who has indicated his/her understanding and acceptance.     Dental advisory given  Plan Discussed with: CRNA  Anesthesia Plan Comments:        Anesthesia Quick Evaluation

## 2020-10-30 NOTE — Anesthesia Procedure Notes (Signed)
Procedure Name: LMA Insertion Date/Time: 10/30/2020 10:42 AM Performed by: Eben Burow, CRNA Pre-anesthesia Checklist: Patient identified, Emergency Drugs available, Suction available, Patient being monitored and Timeout performed Patient Re-evaluated:Patient Re-evaluated prior to induction Oxygen Delivery Method: Circle system utilized Preoxygenation: Pre-oxygenation with 100% oxygen Induction Type: IV induction Ventilation: Mask ventilation without difficulty LMA: LMA inserted LMA Size: 4.0 Number of attempts: 1 Placement Confirmation: positive ETCO2 Tube secured with: Tape Dental Injury: Teeth and Oropharynx as per pre-operative assessment

## 2020-10-30 NOTE — Anesthesia Postprocedure Evaluation (Signed)
Anesthesia Post Note  Patient: MILICENT ACHEAMPONG  Procedure(s) Performed: KNEE ARTHROSCOPY WITH PARTIAL MEDIAL MENISECTOMY chondroplasty (Left Knee)     Patient location during evaluation: PACU Anesthesia Type: General Level of consciousness: awake and alert Pain management: pain level controlled Vital Signs Assessment: post-procedure vital signs reviewed and stable Respiratory status: spontaneous breathing, nonlabored ventilation, respiratory function stable and patient connected to nasal cannula oxygen Cardiovascular status: blood pressure returned to baseline and stable Postop Assessment: no apparent nausea or vomiting Anesthetic complications: no   No complications documented.  Last Vitals:  Vitals:   10/30/20 1215 10/30/20 1223  BP: (!) 117/58 (!) 157/88  Pulse:  79  Resp:  18  Temp: 36.4 C 36.6 C  SpO2:  98%    Last Pain:  Vitals:   10/30/20 1223  TempSrc:   PainSc: 0-No pain                 Effie Berkshire

## 2020-10-30 NOTE — Interval H&P Note (Signed)
History and Physical Interval Note:  10/30/2020 10:28 AM  Christina Bolton  has presented today for surgery, with the diagnosis of Left knee medial meniscus tear chondromalacia.  The various methods of treatment have been discussed with the patient and family. After consideration of risks, benefits and other options for treatment, the patient has consented to  Procedure(s) with comments: KNEE ARTHROSCOPY WITH PARTIAL MEDIAL MENISECTOMY chondroplasty (Left) - femoral nerve block knee block with IV sedation as a surgical intervention.  The patient's history has been reviewed, patient examined, no change in status, stable for surgery.  I have reviewed the patient's chart and labs.  Questions were answered to the patient's satisfaction.     Georgie Eduardo ANDREW

## 2020-10-30 NOTE — Anesthesia Procedure Notes (Signed)
Anesthesia Regional Block: Adductor canal block   Pre-Anesthetic Checklist: ,, timeout performed, Correct Patient, Correct Site, Correct Laterality, Correct Procedure, Correct Position, site marked, Risks and benefits discussed,  Surgical consent,  Pre-op evaluation,  At surgeon's request and post-op pain management  Laterality: Left  Prep: chloraprep       Needles:  Injection technique: Single-shot  Needle Type: Echogenic Stimulator Needle     Needle Length: 9cm  Needle Gauge: 21     Additional Needles:   Procedures:,,,, ultrasound used (permanent image in chart),,,,  Narrative:  Start time: 10/30/2020 9:50 AM End time: 10/30/2020 9:55 AM Injection made incrementally with aspirations every 5 mL.  Performed by: Personally  Anesthesiologist: Effie Berkshire, MD  Additional Notes: Patient tolerated the procedure well. Local anesthetic introduced in an incremental fashion under minimal resistance after negative aspirations. No paresthesias were elicited. After completion of the procedure, no acute issues were identified and patient continued to be monitored by RN.   High Adductor Canal Block

## 2020-10-30 NOTE — Transfer of Care (Signed)
Immediate Anesthesia Transfer of Care Note  Patient: Christina Bolton  Procedure(s) Performed: KNEE ARTHROSCOPY WITH PARTIAL MEDIAL MENISECTOMY chondroplasty (Left Knee)  Patient Location: PACU  Anesthesia Type:General  Level of Consciousness: awake, alert  and patient cooperative  Airway & Oxygen Therapy: Patient Spontanous Breathing and Patient connected to face mask oxygen  Post-op Assessment: Report given to RN and Post -op Vital signs reviewed and stable  Post vital signs: Reviewed and stable  Last Vitals:  Vitals Value Taken Time  BP 124/87 10/30/20 1133  Temp    Pulse 84 10/30/20 1134  Resp 15 10/30/20 1134  SpO2 100 % 10/30/20 1134  Vitals shown include unvalidated device data.  Last Pain:  Vitals:   10/30/20 1004  TempSrc:   PainSc: 0-No pain         Complications: No complications documented.

## 2020-10-30 NOTE — Progress Notes (Signed)
Assisted Dr. Hollis with left, ultrasound guided, adductor canal block. Side rails up, monitors on throughout procedure. See vital signs in flow sheet. Tolerated Procedure well.  

## 2020-10-30 NOTE — Op Note (Signed)
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  Block was administered per anesthesiologist.  IV antibiotics were given within 1 hour of the surgical incision time.  Taken the operating room placed supine position under general anesthesia extremities were padded and bumped the left lower extremity was elevated prepped DuraPrep and draped in sterile fashion timeout done confirm the right side arthroscopy left side arthroscopic portals were established inferomedial inferolateral patellofemoral arthroscopy revealed grade II chondromalacia patella light chondroplasty was performed the shaver.  Suprapatellar pouch mid lateral gutters unremarkable.  ACL and PCL intact lateral side was inspected minimal chondromalacia lateral meniscus was intact medial side inspected grade II chondromalacia midportion with weightbearing surface of the femoral condyle I compress form with a shaver back to stable base medial meniscus showed a complex tear the posterior horn and basket motorized shaver cautery a partial meniscectomy was performed back to stable base part beveled and contoured there is no other abnormalities noted irrigated arthroscopic was removed.  With block with Marcaine in the case escalated with nurse anesthetist portals for nylon suture sterile compressive dressing applied ice pack and ice.  There is no complications.  She was awakened taken operating  PACU stable condition.  Should be stabilized in PACU discharged home start therapy next week see back in office in a couple weeks I think her prognosis is good I would recommend consider significant separation of weight loss reduction as she already shows chondromalacia from condyle and was lost with partially medial meniscus meniscus which was necessary this may compound the problem with shoe that we discussed in depth postoperatively.

## 2020-10-31 ENCOUNTER — Encounter (HOSPITAL_COMMUNITY): Payer: Self-pay | Admitting: Specialist

## 2020-11-15 LAB — EXTERNAL GENERIC LAB PROCEDURE: COLOGUARD: NEGATIVE

## 2020-12-30 ENCOUNTER — Ambulatory Visit (INDEPENDENT_AMBULATORY_CARE_PROVIDER_SITE_OTHER): Payer: BC Managed Care – PPO

## 2020-12-30 ENCOUNTER — Encounter: Payer: Self-pay | Admitting: Podiatry

## 2020-12-30 ENCOUNTER — Other Ambulatory Visit: Payer: Self-pay

## 2020-12-30 ENCOUNTER — Ambulatory Visit (INDEPENDENT_AMBULATORY_CARE_PROVIDER_SITE_OTHER): Payer: BC Managed Care – PPO | Admitting: Podiatry

## 2020-12-30 DIAGNOSIS — L600 Ingrowing nail: Secondary | ICD-10-CM | POA: Diagnosis not present

## 2020-12-30 DIAGNOSIS — S99911A Unspecified injury of right ankle, initial encounter: Secondary | ICD-10-CM | POA: Diagnosis not present

## 2020-12-30 NOTE — Patient Instructions (Signed)

## 2020-12-30 NOTE — Progress Notes (Signed)
Subjective:   Patient ID: Christina Bolton, female   DOB: 55 y.o.   MRN: [id number]   HPI Patient states she had a significant sprain of her ankle 3 weeks ago and is worried about the bone and also has a chronic ingrown toenail of her right big toe that is been sore and makes it hard for her to wear shoe gear and she cannot trim herself and it is been present around a year.  Has not had any other changes health history   ROS      Objective:  Physical Exam  Neurovascular status intact with patient noted to have an incurvated right hallux lateral border painful when pressed with irritation of the tissue and is noted to have swelling of the medial ankle right with some splinting and loss of range of motion.  There is discomfort mostly around the tendon but I did not discount the possibility for bony injury     Assessment:  Significant sprain of the right ankle medial side and ingrown toenail deformity right hallux lateral border     Plan:  H&P reviewed both conditions.  For the ankle reviewed x-rays applied ankle compression stocking and patient will gradually rehab and should get better in the next 6 to 12 weeks and for the ingrown I recommended correction and allowed her to read a correct consent form reaction.  I explained procedure risk and she wants surgery and today I infiltrated the right hallux 60 mg like Marcaine mixture sterile prep done and using sterile instrumentation remove the lateral border exposed matrix and applied phenol 3 applications 30 seconds followed by alcohol lavage sterile dressing.  Gave instructions on soaks and to leave dressing on 24 hours but take it off early if any kind of throbbing were to occur.  X-rays indicate no signs of diastases injury or fracture associated with right ankle

## 2021-12-09 ENCOUNTER — Other Ambulatory Visit: Payer: Self-pay | Admitting: Physician Assistant

## 2021-12-09 DIAGNOSIS — Z1231 Encounter for screening mammogram for malignant neoplasm of breast: Secondary | ICD-10-CM

## 2021-12-17 ENCOUNTER — Ambulatory Visit: Payer: BC Managed Care – PPO | Admitting: Podiatry

## 2021-12-17 ENCOUNTER — Ambulatory Visit: Payer: 59

## 2021-12-30 ENCOUNTER — Ambulatory Visit: Payer: 59

## 2022-02-27 ENCOUNTER — Ambulatory Visit
Admission: RE | Admit: 2022-02-27 | Discharge: 2022-02-27 | Disposition: A | Discharge status: Home / Self Care | Payer: 59 | Source: Ambulatory Visit | Attending: Physician Assistant | Admitting: Physician Assistant

## 2022-02-27 DIAGNOSIS — Z1231 Encounter for screening mammogram for malignant neoplasm of breast: Secondary | ICD-10-CM

## 2022-03-02 ENCOUNTER — Ambulatory Visit (INDEPENDENT_AMBULATORY_CARE_PROVIDER_SITE_OTHER): Payer: BC Managed Care – PPO | Admitting: Podiatry

## 2022-03-02 ENCOUNTER — Ambulatory Visit (INDEPENDENT_AMBULATORY_CARE_PROVIDER_SITE_OTHER): Payer: BC Managed Care – PPO

## 2022-03-02 DIAGNOSIS — M722 Plantar fascial fibromatosis: Secondary | ICD-10-CM

## 2022-03-02 MED ORDER — TRIAMCINOLONE ACETONIDE 10 MG/ML IJ SUSP
20.0000 mg | Freq: Once | INTRAMUSCULAR | Status: AC
  Administered 2022-03-02: 20 mg

## 2022-03-02 MED ORDER — DICLOFENAC SODIUM 75 MG PO TBEC: 75.0000 mg | DELAYED_RELEASE_TABLET | Freq: Two times a day (BID) | ORAL | 2 refills | Status: AC

## 2022-03-02 NOTE — Patient Instructions (Signed)

## 2022-03-04 NOTE — Progress Notes (Signed)
Subjective:  ? ?Patient ID: Christina Bolton, female   DOB: 56 y.o.   MRN: [id number]  ? ?HPI ?Patient states that she has developed increased pain in the heel region of both feet stating that its been inflamed at the insertion ? ? ?ROS ? ? ?   ?Objective:  ?Physical Exam  ?Acute plantar fasciitis with pain at the medial band of the fascia at its insertion into the calcaneus bilateral ? ?   ?Assessment:  ?Acute reoccurrence Planter fasciitis bilateral that did do well for an extended period of time ? ?   ?Plan:  ?H&P x-rays reviewed sterile prep and injected the fascial bilateral 3 mg Kenalog 5 mg Xylocaine and instructed on supportive therapy anti-inflammatories and did dispense fascial brace right to lift up the arch and take pressure off the plantar fascia.  Also may need new orthotics we will see this patient and decide at just we see how she responded to this treatment ? ?X-rays indicate there is some flattening of the arch and spur formation in the calcaneus bilateral at insertion ?   ? ? ?

## 2022-03-19 ENCOUNTER — Ambulatory Visit: Payer: BC Managed Care – PPO | Admitting: Podiatry

## 2022-05-06 IMAGING — MG MM DIGITAL SCREENING BILAT W/ TOMO AND CAD
6 of 12 series · 6 of 36 positions shown · non-contrast
Comparison: Previous exam(s).

CLINICAL DATA: Screening.

EXAM:
DIGITAL SCREENING BILATERAL MAMMOGRAM WITH TOMOSYNTHESIS AND CAD
TECHNIQUE: Bilateral screening digital craniocaudal and mediolateral oblique
mammograms were obtained. Bilateral screening digital breast
tomosynthesis was performed. The images were evaluated with
computer-aided detection.

[L MLO synth-2D (1 of 2)]
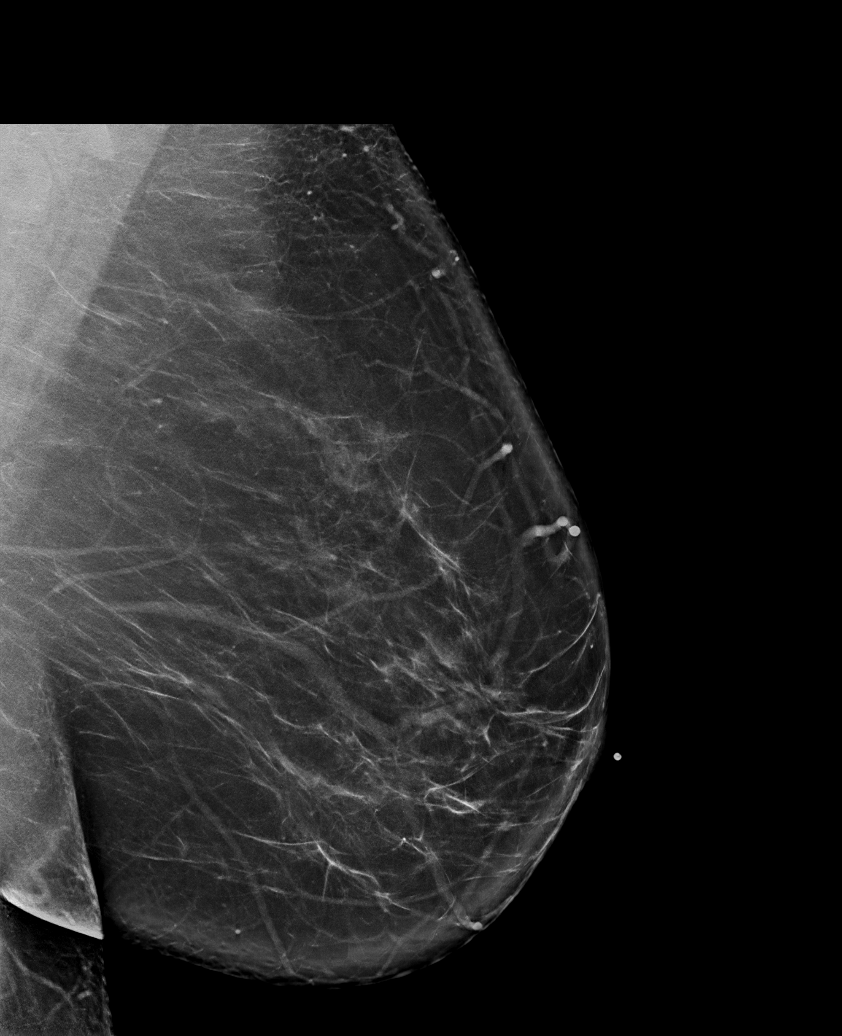

[R MLO synth-2D (1 of 2)]
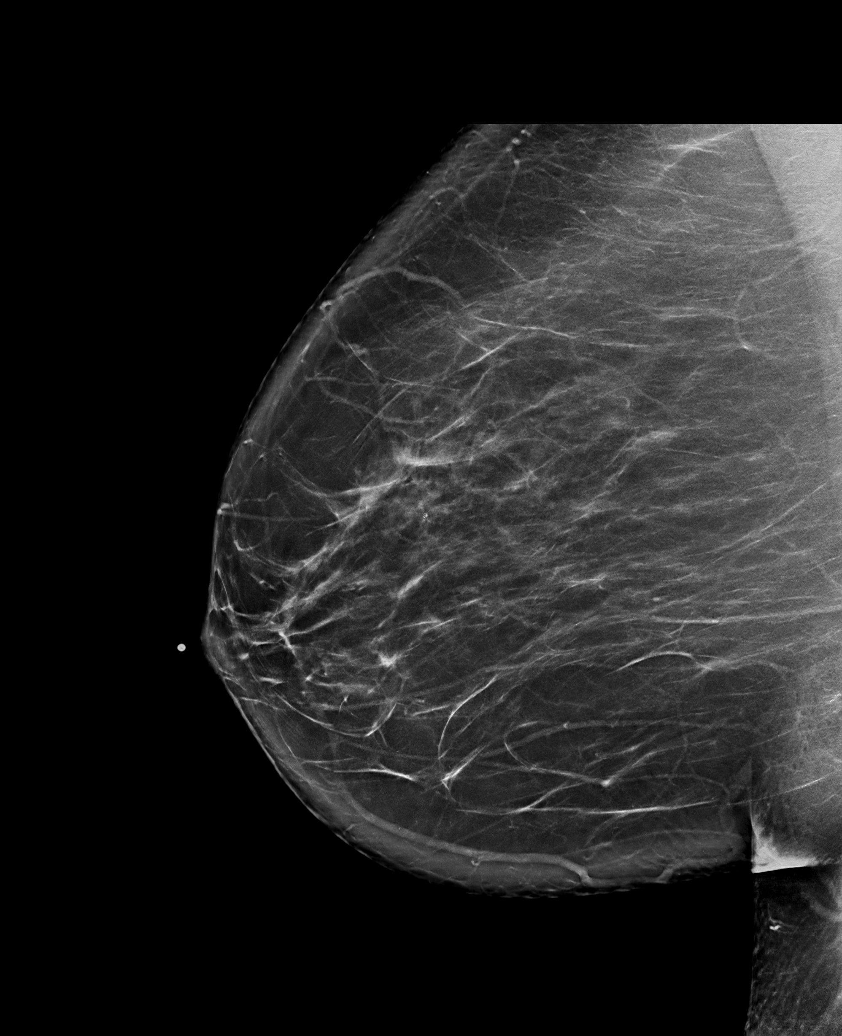

[L MLO synth-2D (2 of 2)]
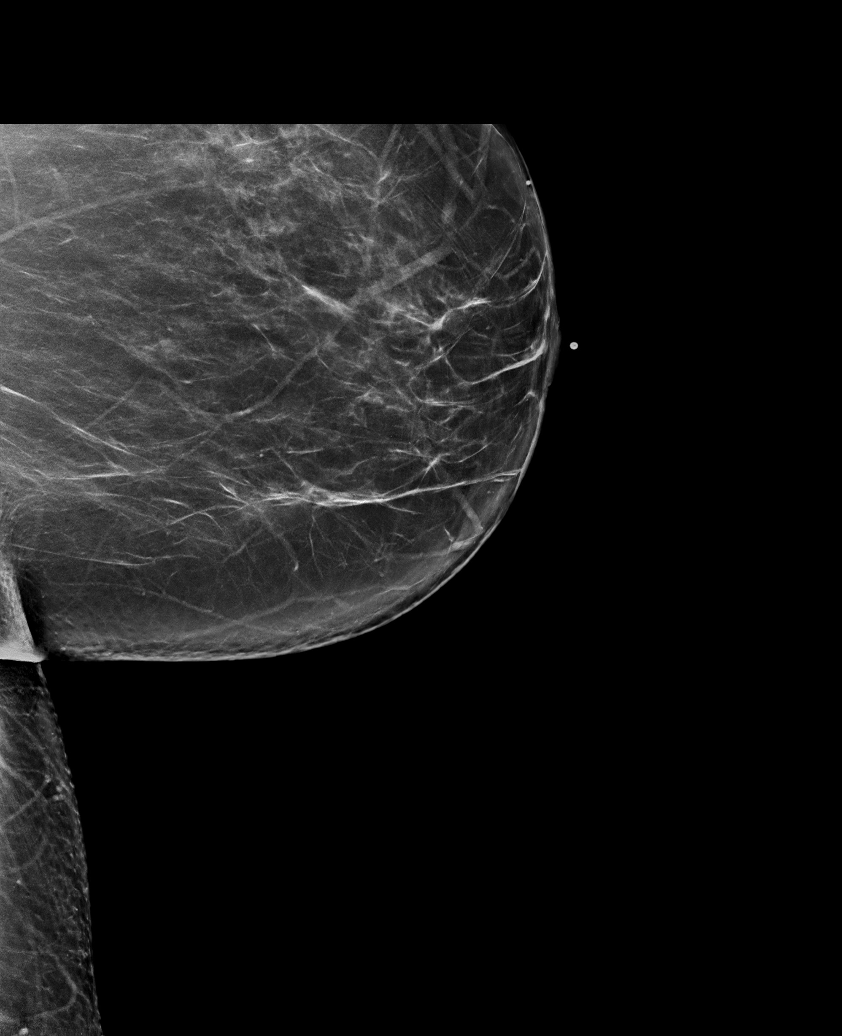

[R CC synth-2D]
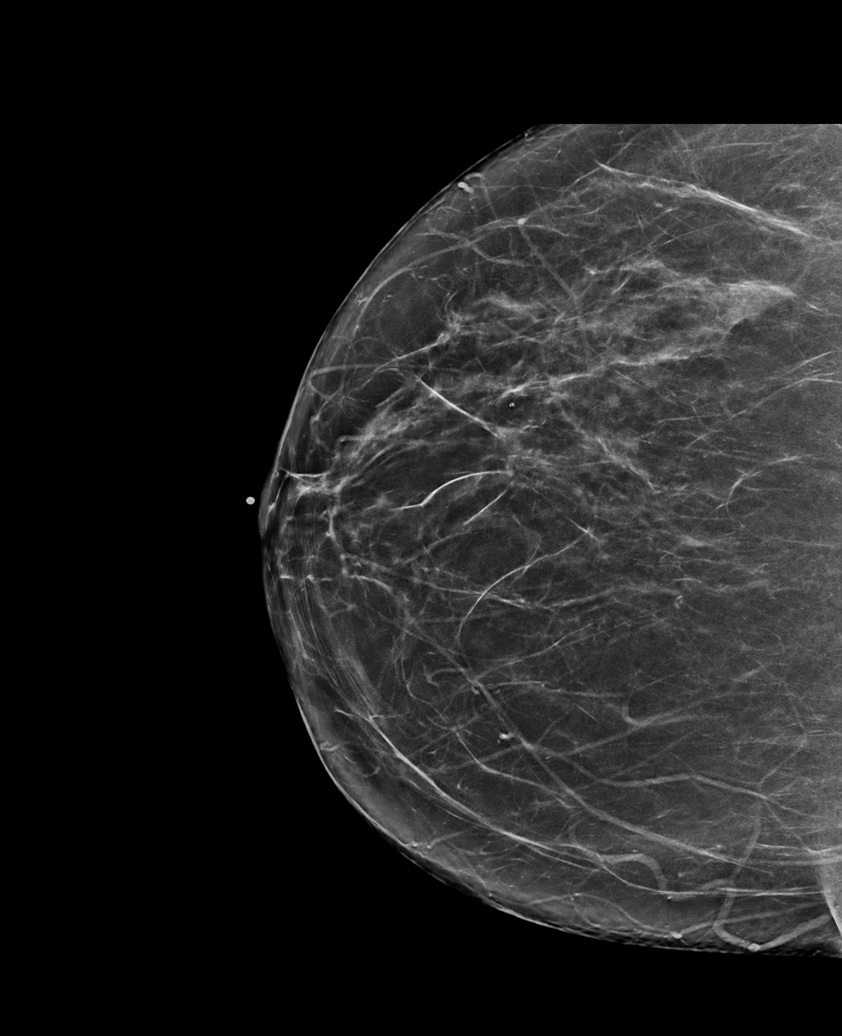

[L CC synth-2D]
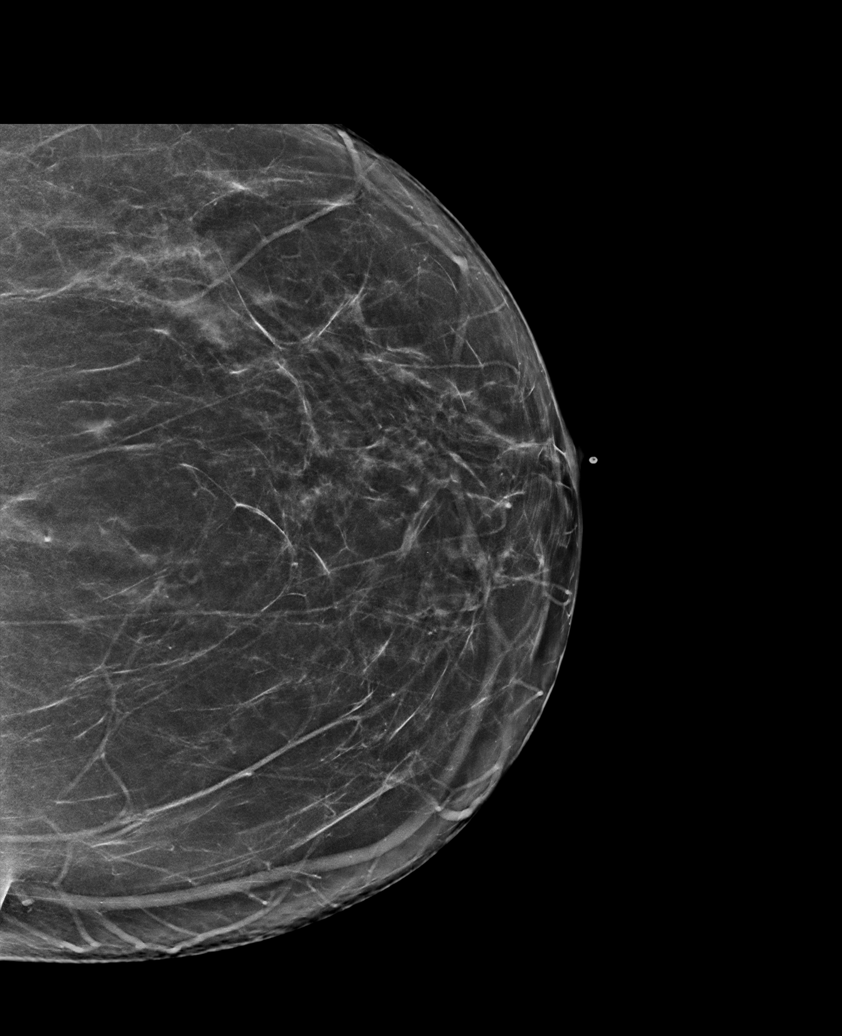

[R MLO synth-2D (2 of 2)]
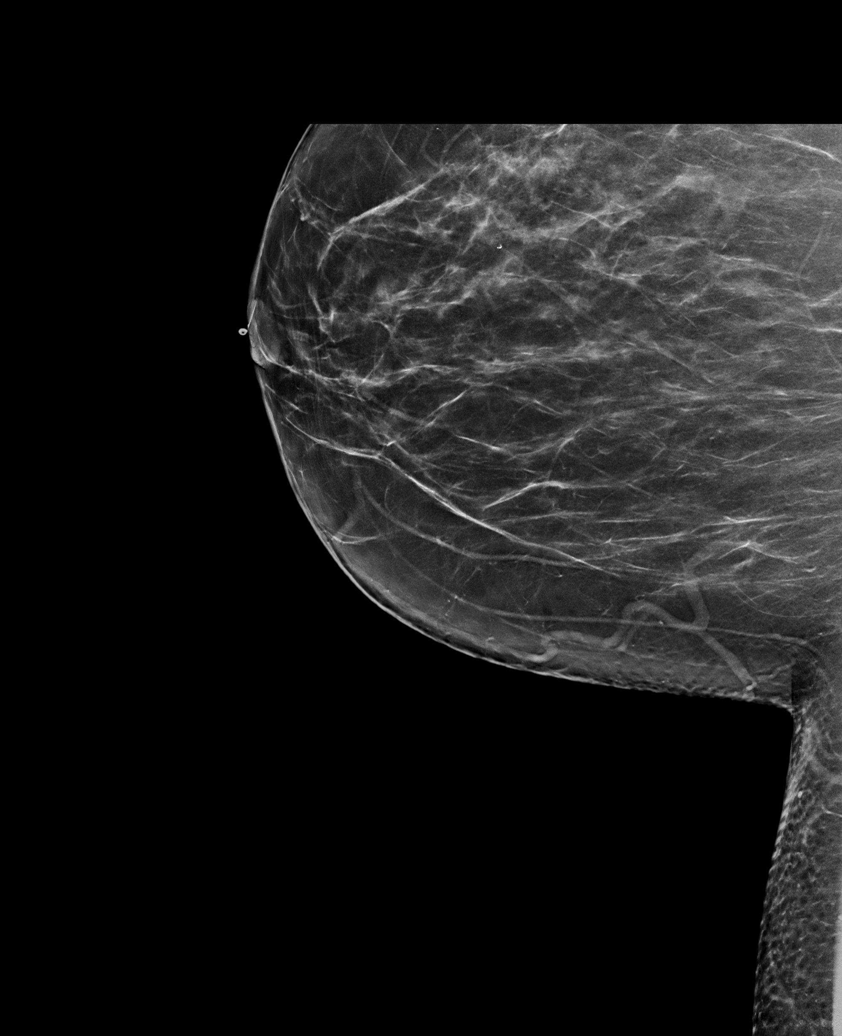

[6 of 36 positions shown; findings below may reference images not displayed]

ACR Breast Density Category b: There are scattered areas of
fibroglandular density.
FINDINGS: There are no findings suspicious for malignancy.
IMPRESSION: No mammographic evidence of malignancy. A result letter of this
screening mammogram will be mailed directly to the patient.

RECOMMENDATION:
Screening mammogram in one year. (Code:51-O-LD2)

BI-RADS CATEGORY  1: Negative.

## 2022-06-26 ENCOUNTER — Encounter: Payer: Self-pay | Admitting: Podiatry

## 2022-06-26 ENCOUNTER — Ambulatory Visit (INDEPENDENT_AMBULATORY_CARE_PROVIDER_SITE_OTHER): Payer: BC Managed Care – PPO | Admitting: Podiatry

## 2022-06-26 DIAGNOSIS — M722 Plantar fascial fibromatosis: Secondary | ICD-10-CM

## 2022-06-26 DIAGNOSIS — M7662 Achilles tendinitis, left leg: Secondary | ICD-10-CM

## 2022-06-26 MED ORDER — DICLOFENAC SODIUM 75 MG PO TBEC: 75.0000 mg | DELAYED_RELEASE_TABLET | Freq: Two times a day (BID) | ORAL | 2 refills | Status: DC

## 2022-06-26 MED ORDER — TRIAMCINOLONE ACETONIDE 10 MG/ML IJ SUSP
10.0000 mg | Freq: Once | INTRAMUSCULAR | Status: AC
  Administered 2022-06-26: 10 mg

## 2022-06-26 NOTE — Patient Instructions (Signed)

## 2022-06-28 NOTE — Progress Notes (Signed)
Subjective:   Patient ID: Christina Bolton, female   DOB: 56 y.o.   MRN: [id number]   HPI   Patient presents stating her left heel has really started to hurt her again and states it seemed to be better for a few months but then it reoccurred and she knows she probably needs some kind of an orthotic device   ROS      Objective:  Physical Exam  Neurovascular status intact with inflammation pain of the left plantar fascia at the insertion calcaneus with fluid buildup and moderate depression of the arch     Assessment:  Acute plantar fasciitis left with structural changes contributing to the problem     Plan:  Reviewed condition with patient today went ahead did sterile prep reinjected the fascia 3 mg Kenalog 5 mg Xylocaine applied sterile dressing and casted for functional orthotics to lift the arch up and take pressure off the plantar foot and also encourage weight loss

## 2022-10-23 ENCOUNTER — Other Ambulatory Visit: Payer: Self-pay

## 2022-12-02 ENCOUNTER — Other Ambulatory Visit: Payer: BC Managed Care – PPO

## 2022-12-06 NOTE — Progress Notes (Deleted)
Patient presents today to pick up custom molded foot orthotics recommended by Dr. Paulla Dolly.   Orthotics were dispensed and fit was satisfactory. Reviewed instructions for break-in and wear. Written instructions given to patient.  Patient will follow up as needed.   [facility] - order # ***

## 2022-12-08 ENCOUNTER — Other Ambulatory Visit: Payer: BC Managed Care – PPO

## 2022-12-08 DIAGNOSIS — M722 Plantar fascial fibromatosis: Secondary | ICD-10-CM

## 2022-12-23 ENCOUNTER — Telehealth: Payer: Self-pay | Admitting: Podiatry

## 2022-12-23 NOTE — Telephone Encounter (Signed)
Lmom to call back to schedule picking up orthotics   No balance

## 2023-01-08 ENCOUNTER — Ambulatory Visit (INDEPENDENT_AMBULATORY_CARE_PROVIDER_SITE_OTHER): Payer: BC Managed Care – PPO

## 2023-01-08 DIAGNOSIS — M722 Plantar fascial fibromatosis: Secondary | ICD-10-CM

## 2023-01-08 NOTE — Progress Notes (Signed)
Patient presents today to pick up custom molded foot orthotics recommended by Dr. Paulla Dolly.   Orthotics were dispensed and fit was satisfactory. Reviewed instructions for break-in and wear. Written instructions given to patient.  Patient will follow up as needed.

## 2023-03-17 ENCOUNTER — Other Ambulatory Visit: Payer: Self-pay | Admitting: Podiatry

## 2024-02-02 ENCOUNTER — Other Ambulatory Visit: Payer: Self-pay | Admitting: Physician Assistant

## 2024-02-02 DIAGNOSIS — Z1231 Encounter for screening mammogram for malignant neoplasm of breast: Secondary | ICD-10-CM

## 2024-02-03 ENCOUNTER — Ambulatory Visit (INDEPENDENT_AMBULATORY_CARE_PROVIDER_SITE_OTHER): Admitting: Podiatry

## 2024-02-03 ENCOUNTER — Encounter: Payer: Self-pay | Admitting: Podiatry

## 2024-02-03 DIAGNOSIS — L600 Ingrowing nail: Secondary | ICD-10-CM

## 2024-02-03 NOTE — Patient Instructions (Signed)

## 2024-02-03 NOTE — Progress Notes (Signed)
 Subjective:   Patient ID: Christina Bolton, female   DOB: 58 y.o.   MRN: [id number]   HPI Patient presents stating this nail is really bothering me and I know I need to get it removed   ROS      Objective:  Physical Exam  Neurovascular status intact good digital perfusion noted significant incurvation thickness and pain of the right second nail painful when pressed     Assessment:  Damaged second nail right painful when pressed with structural changes     Plan:  H&P reviewed and I have recommended permanent nail removal explained procedure to patient and she wants to get this done.  I explained surgery what would be required and she is willing to accept risk of procedure and signed consent form.  I infiltrated the right second toe 60 mg like Marcaine mixture sterile prep done using sterile instrumentation remove the nail exposed matrix applied phenol 3 applications 30 seconds followed by alcohol by sterile dressing gave instructions on soaks wear dressing 24 hours taken off earlier if throbbing were to occur and encouraged to call with questions concerns which might arise

## 2024-02-10 ENCOUNTER — Ambulatory Visit
Admission: RE | Admit: 2024-02-10 | Discharge: 2024-02-10 | Disposition: A | Discharge status: Home / Self Care | Source: Ambulatory Visit | Attending: Physician Assistant | Admitting: Physician Assistant

## 2024-02-10 DIAGNOSIS — Z1231 Encounter for screening mammogram for malignant neoplasm of breast: Secondary | ICD-10-CM

## 2024-02-14 ENCOUNTER — Other Ambulatory Visit: Payer: Self-pay | Admitting: Physician Assistant

## 2024-02-14 DIAGNOSIS — R928 Other abnormal and inconclusive findings on diagnostic imaging of breast: Secondary | ICD-10-CM

## 2024-02-21 ENCOUNTER — Ambulatory Visit
Admission: RE | Admit: 2024-02-21 | Discharge: 2024-02-21 | Disposition: A | Discharge status: Home / Self Care | Source: Ambulatory Visit | Attending: Physician Assistant | Admitting: Physician Assistant

## 2024-02-21 DIAGNOSIS — R928 Other abnormal and inconclusive findings on diagnostic imaging of breast: Secondary | ICD-10-CM
